# Patient Record
Sex: Male | Born: 1985
Health system: Southern US, Community
[De-identification: ages and names within clinical notes are randomized; demographics above are authoritative.]

## PROBLEM LIST (undated history)

## (undated) DIAGNOSIS — Z8619 Personal history of other infectious and parasitic diseases: Secondary | ICD-10-CM

## (undated) HISTORY — PX: WISDOM TOOTH EXTRACTION: SHX21

## (undated) HISTORY — DX: Personal history of other infectious and parasitic diseases: Z86.19

---

## 2016-02-07 DIAGNOSIS — M9905 Segmental and somatic dysfunction of pelvic region: Secondary | ICD-10-CM | POA: Diagnosis not present

## 2016-02-07 DIAGNOSIS — M9903 Segmental and somatic dysfunction of lumbar region: Secondary | ICD-10-CM | POA: Diagnosis not present

## 2016-02-07 DIAGNOSIS — M62830 Muscle spasm of back: Secondary | ICD-10-CM | POA: Diagnosis not present

## 2016-02-07 DIAGNOSIS — M9904 Segmental and somatic dysfunction of sacral region: Secondary | ICD-10-CM | POA: Diagnosis not present

## 2016-02-22 DIAGNOSIS — M62830 Muscle spasm of back: Secondary | ICD-10-CM | POA: Diagnosis not present

## 2016-02-22 DIAGNOSIS — M9905 Segmental and somatic dysfunction of pelvic region: Secondary | ICD-10-CM | POA: Diagnosis not present

## 2016-02-22 DIAGNOSIS — M9904 Segmental and somatic dysfunction of sacral region: Secondary | ICD-10-CM | POA: Diagnosis not present

## 2016-02-22 DIAGNOSIS — M9903 Segmental and somatic dysfunction of lumbar region: Secondary | ICD-10-CM | POA: Diagnosis not present

## 2017-04-10 DIAGNOSIS — J019 Acute sinusitis, unspecified: Secondary | ICD-10-CM | POA: Diagnosis not present

## 2017-04-24 ENCOUNTER — Encounter: Payer: Self-pay | Admitting: Family Medicine

## 2017-04-24 ENCOUNTER — Ambulatory Visit (INDEPENDENT_AMBULATORY_CARE_PROVIDER_SITE_OTHER): Payer: BLUE CROSS/BLUE SHIELD | Admitting: Family Medicine

## 2017-04-24 VITALS — BP 106/58 | HR 69 | Temp 98.1°F | Ht 74.0 in | Wt 171.4 lb

## 2017-04-24 DIAGNOSIS — J3489 Other specified disorders of nose and nasal sinuses: Secondary | ICD-10-CM

## 2017-04-24 MED ORDER — FLUTICASONE PROPIONATE 50 MCG/ACT NA SUSP: 2.0000 | Freq: Every day | NASAL | 6 refills | Status: DC

## 2017-04-24 NOTE — Progress Notes (Signed)
Chief Complaint  Patient presents with  . Establish Care    pt want to discuss sinus pressure-pt was seen at Urgent Care       New Patient Visit SUBJECTIVE: HPI: Patrick Dyer is an 31 y.o.male who is being seen for establishing care. Here with wife and young daughter.  For past 6 months, patient has been having issues with his left maxillary sinus. He had a painful sinus infection in January that resolved with antibiotics. He had dental pain preceding this and went to the dentist thinking he had a problem. Recently, his wife and daughter got sick and passed onto him. Shortly after he had it, he started having the severe pain in his teeth and left maxillary sinus. He recently received a course of augmentin from urgent care and was told to follow-up with a primary care physician. He is having some mild pain today. That is why he presents here today. He denies any pus or foul odor from his nose or mouth. He has never had any fevers. Denies history of recurrent infections. He has associated ear pain/popping and some congestion. He does not use a nasal spray.     Allergies  Allergen Reactions  . Sulfa Antibiotics Swelling    Redness of the skin    Past Medical History:  Diagnosis Date  . History of chicken pox    Past Surgical History:  Procedure Laterality Date  . WISDOM TOOTH EXTRACTION     Social History   Social History  . Marital status: Married   Social History Main Topics  . Smoking status: Never Smoker  . Smokeless tobacco: Never Used  . Alcohol use No  . Drug use: No   Family History  Problem Relation Age of Onset  . Cancer Neg Hx      Current Outpatient Prescriptions:  .  fluticasone (FLONASE) 50 MCG/ACT nasal spray, Place 2 sprays into both nostrils daily., Disp: 16 g, Rfl: 6  ROS Const: Denies fevers  HEENT: As noted in HPI   OBJECTIVE: BP (!) 106/58 (BP Location: Left Arm, Patient Position: Sitting, Cuff Size: Normal)   Pulse 69   Temp 98.1 F (36.7 C)  (Oral)   Ht 6\' 2"  (1.88 m)   Wt 171 lb 6.4 oz (77.7 kg)   SpO2 98%   BMI 22.01 kg/m   Constitutional: -  VS reviewed -  Well developed, well nourished, appears stated age -  No apparent distress  Psychiatric: -  Oriented to person, place, and time -  Memory intact -  Affect and mood normal -  Fluent conversation, good eye contact -  Judgment and insight age appropriate  Eye: -  Conjunctivae clear, no discharge -  Pupils symmetric, round, reactive to light  ENMT: -  Ears are patent b/l without erythema or discharge. TM's are shiny and clear b/l without evidence of effusion or infection. -  Nares patent, no D/C, mild TTP over L max sinus -  Oral mucosa without lesions, tongue and uvula midline    Tonsils not enlarged, no erythema, no exudate, trachea midline    Pharynx moist, no lesions, no erythema  Neck: -  No gross swelling, no palpable masses -  Thyroid midline, not enlarged, mobile, no palpable masses  Cardiovascular: -  RRR, no murmurs -  No LE edema  Respiratory: -  Normal respiratory effort, no accessory muscle use, no retraction -  Breath sounds equal, no wheezes, no ronchi, no crackles  Skin: -  No significant  lesion on inspection -  Warm and dry to palpation   ASSESSMENT/PLAN: Sinus pain - Plan: fluticasone (FLONASE) 50 MCG/ACT nasal spray, CT Maxillofacial WO CM  Start nasal spray daily, counseled on appropriate use. Discussed OTC antihistamines to trial. CT sinus to r/o anatomic issues. Will likely refer to ENT following results.  Patient should return prn. The patient voiced understanding and agreement to the plan.   Jilda Rocheicholas Paul [place], DO 04/24/17  4:59 PM

## 2017-04-24 NOTE — Patient Instructions (Signed)
If you do not hear anything about your CT scan in the next 1-2 weeks, call our office and ask for an update.  Claritin (loratadine), Allegra (fexofenadine), Zyrtec (cetirizine); these are listed in order from weakest to strongest. Generic, and therefore cheaper, options are in the parentheses.   Flonase (fluticasone); nasal spray that is over the counter. 2 sprays each nostril, once daily. Aim towards the same side eye when you spray.  There are available OTC, and the generic versions, which may be cheaper, are in parentheses. Show this to a pharmacist if you have trouble finding any of these items.

## 2017-04-25 ENCOUNTER — Other Ambulatory Visit: Payer: Self-pay | Admitting: Family Medicine

## 2017-04-25 ENCOUNTER — Ambulatory Visit (HOSPITAL_BASED_OUTPATIENT_CLINIC_OR_DEPARTMENT_OTHER)
Admission: RE | Admit: 2017-04-25 | Discharge: 2017-04-25 | Disposition: A | Discharge status: Home / Self Care | Payer: BLUE CROSS/BLUE SHIELD | Source: Ambulatory Visit | Attending: Family Medicine | Admitting: Family Medicine

## 2017-04-25 DIAGNOSIS — J3489 Other specified disorders of nose and nasal sinuses: Secondary | ICD-10-CM | POA: Diagnosis not present

## 2017-04-25 DIAGNOSIS — J320 Chronic maxillary sinusitis: Secondary | ICD-10-CM | POA: Diagnosis not present

## 2017-04-25 DIAGNOSIS — J342 Deviated nasal septum: Secondary | ICD-10-CM | POA: Insufficient documentation

## 2017-04-25 DIAGNOSIS — J341 Cyst and mucocele of nose and nasal sinus: Secondary | ICD-10-CM

## 2017-04-25 NOTE — Progress Notes (Signed)
ENT order placed. TY.

## 2017-05-20 DIAGNOSIS — J341 Cyst and mucocele of nose and nasal sinus: Secondary | ICD-10-CM | POA: Diagnosis not present

## 2021-03-14 ENCOUNTER — Encounter (HOSPITAL_BASED_OUTPATIENT_CLINIC_OR_DEPARTMENT_OTHER): Payer: Self-pay | Admitting: Emergency Medicine

## 2021-03-14 ENCOUNTER — Emergency Department (HOSPITAL_BASED_OUTPATIENT_CLINIC_OR_DEPARTMENT_OTHER): Payer: BC Managed Care – PPO

## 2021-03-14 ENCOUNTER — Other Ambulatory Visit: Payer: Self-pay

## 2021-03-14 ENCOUNTER — Emergency Department (HOSPITAL_BASED_OUTPATIENT_CLINIC_OR_DEPARTMENT_OTHER)
Admission: EM | Admit: 2021-03-14 | Discharge: 2021-03-14 | Disposition: A | Discharge status: Home / Self Care | Payer: BC Managed Care – PPO | Attending: Emergency Medicine | Admitting: Emergency Medicine

## 2021-03-14 DIAGNOSIS — R109 Unspecified abdominal pain: Secondary | ICD-10-CM | POA: Diagnosis not present

## 2021-03-14 DIAGNOSIS — N200 Calculus of kidney: Secondary | ICD-10-CM

## 2021-03-14 DIAGNOSIS — N201 Calculus of ureter: Secondary | ICD-10-CM

## 2021-03-14 DIAGNOSIS — N132 Hydronephrosis with renal and ureteral calculous obstruction: Secondary | ICD-10-CM | POA: Insufficient documentation

## 2021-03-14 DIAGNOSIS — N202 Calculus of kidney with calculus of ureter: Secondary | ICD-10-CM | POA: Diagnosis not present

## 2021-03-14 LAB — COMPREHENSIVE METABOLIC PANEL
ALT: 18 U/L (ref 0–44)
AST: 16 U/L (ref 15–41)
Albumin: 4.2 g/dL (ref 3.5–5.0)
Alkaline Phosphatase: 51 U/L (ref 38–126)
Anion gap: 8 (ref 5–15)
BUN: 14 mg/dL (ref 6–20)
CO2: 27 mmol/L (ref 22–32)
Calcium: 9.1 mg/dL (ref 8.9–10.3)
Chloride: 104 mmol/L (ref 98–111)
Creatinine, Ser: 1.15 mg/dL (ref 0.61–1.24)
GFR, Estimated: >60 mL/min (ref >60)
Glucose, Bld: 119 mg/dL — ABNORMAL HIGH (ref 70–99)
Potassium: 4 mmol/L (ref 3.5–5.1)
Sodium: 139 mmol/L (ref 135–145)
Total Bilirubin: 0.7 mg/dL (ref 0.3–1.2)
Total Protein: 7.5 g/dL (ref 6.5–8.1)

## 2021-03-14 LAB — URINALYSIS, ROUTINE W REFLEX MICROSCOPIC
Bilirubin Urine: NEGATIVE
Glucose, UA: NEGATIVE mg/dL
Ketones, ur: NEGATIVE mg/dL
Leukocytes,Ua: NEGATIVE
Nitrite: NEGATIVE
Protein, ur: NEGATIVE mg/dL
Specific Gravity, Urine: 1.02 (ref 1.005–1.030)
pH: 6.5 (ref 5.0–8.0)

## 2021-03-14 LAB — CBC WITH DIFFERENTIAL/PLATELET
Abs Immature Granulocytes: 0.02 10*3/uL (ref 0.00–0.07)
Basophils Absolute: 0 10*3/uL (ref 0.0–0.1)
Basophils Relative: 0 %
Eosinophils Absolute: 0.1 10*3/uL (ref 0.0–0.5)
Eosinophils Relative: 1 %
HCT: 41.3 % (ref 39.0–52.0)
Hemoglobin: 14.6 g/dL (ref 13.0–17.0)
Immature Granulocytes: 0 %
Lymphocytes Relative: 8 %
Lymphs Abs: 0.8 10*3/uL (ref 0.7–4.0)
MCH: 30 pg (ref 26.0–34.0)
MCHC: 35.4 g/dL (ref 30.0–36.0)
MCV: 84.8 fL (ref 80.0–100.0)
Monocytes Absolute: 0.6 10*3/uL (ref 0.1–1.0)
Monocytes Relative: 6 %
Neutro Abs: 8.4 10*3/uL — ABNORMAL HIGH (ref 1.7–7.7)
Neutrophils Relative %: 85 %
Platelets: 200 10*3/uL (ref 150–400)
RBC: 4.87 MIL/uL (ref 4.22–5.81)
RDW: 11.8 % (ref 11.5–15.5)
WBC: 9.9 10*3/uL (ref 4.0–10.5)
nRBC: 0 % (ref 0.0–0.2)

## 2021-03-14 LAB — LIPASE, BLOOD: Lipase: 22 U/L (ref 11–51)

## 2021-03-14 LAB — URINALYSIS, MICROSCOPIC (REFLEX)

## 2021-03-14 MED ORDER — KETOROLAC TROMETHAMINE 15 MG/ML IJ SOLN
15.0000 mg | Freq: Once | INTRAMUSCULAR | Status: AC
  Administered 2021-03-14: 15 mg via INTRAVENOUS
  Filled 2021-03-14: qty 1

## 2021-03-14 MED ORDER — LACTATED RINGERS IV BOLUS
1000.0000 mL | Freq: Once | INTRAVENOUS | Status: AC
  Administered 2021-03-14: 1000 mL via INTRAVENOUS

## 2021-03-14 MED ORDER — OXYCODONE HCL 5 MG PO TABS: 5.0000 mg | ORAL_TABLET | Freq: Four times a day (QID) | ORAL | 0 refills | Status: DC | PRN

## 2021-03-14 MED ORDER — ONDANSETRON 4 MG PO TBDP: 4.0000 mg | ORAL_TABLET | Freq: Three times a day (TID) | ORAL | 0 refills | Status: DC | PRN

## 2021-03-14 NOTE — ED Triage Notes (Signed)
Pt states he woke up around 3 am with pain in his right flank area  Pt states the pain was sharp in nature and radiated around to the front and to the groin area  Pt states he took some tylenol and that has helped some   No hx of kidney stones  Pt denies nausea or vomiting at this time

## 2021-03-14 NOTE — ED Provider Notes (Signed)
[facility] EMERGENCY DEPARTMENT Provider Note   CSN: [id number] Arrival date & time: 03/14/21  2353     History Chief Complaint  Patient presents with  . Flank Pain    Patrick Dyer is a 35 y.o. male.  The history is provided by the patient.  Flank Pain This is a new problem. The current episode started 3 to 5 hours ago. The problem occurs constantly. The problem has not changed since onset.Associated symptoms include abdominal pain. Pertinent negatives include no chest pain, no headaches and no shortness of breath. Nothing aggravates the symptoms. The symptoms are relieved by acetaminophen. He has tried acetaminophen for the symptoms. The treatment provided moderate relief.       Past Medical History:  Diagnosis Date  . History of chicken pox     There are no problems to display for this patient.   Past Surgical History:  Procedure Laterality Date  . WISDOM TOOTH EXTRACTION         Family History  Problem Relation Age of Onset  . Cancer Neg Hx     Social History   Tobacco Use  . Smoking status: Never Smoker  . Smokeless tobacco: Never Used  Vaping Use  . Vaping Use: Never used  Substance Use Topics  . Alcohol use: No  . Drug use: No    Home Medications Prior to Admission medications   Medication Sig Start Date End Date Taking? Authorizing Provider  ondansetron (ZOFRAN ODT) 4 MG disintegrating tablet Take 1 tablet (4 mg total) by mouth every 8 (eight) hours as needed for up to 10 doses for nausea or vomiting. 03/14/21  Yes Sabino Donovan, MD  oxyCODONE (ROXICODONE) 5 MG immediate release tablet Take 1 tablet (5 mg total) by mouth every 6 (six) hours as needed for up to 16 doses for severe pain. 03/14/21  Yes Sabino Donovan, MD  fluticasone [facility]) 50 MCG/ACT nasal spray Place 2 sprays into both nostrils daily. 04/24/17   Sharlene Dory, DO    Allergies    Sulfa antibiotics  Review of Systems   Review of Systems  Constitutional: Negative  for chills and fever.  HENT: Negative for congestion and rhinorrhea.   Respiratory: Negative for cough and shortness of breath.   Cardiovascular: Negative for chest pain and palpitations.  Gastrointestinal: Positive for abdominal pain and nausea. Negative for diarrhea and vomiting.  Genitourinary: Positive for flank pain. Negative for difficulty urinating, dysuria and hematuria.  Musculoskeletal: Negative for arthralgias and back pain.  Skin: Negative for color change and rash.  Neurological: Negative for light-headedness and headaches.    Physical Exam Updated Vital Signs BP 120/83 (BP Location: Right Arm)   Pulse 80   Temp 98.1 F (36.7 C) (Oral)   Resp 16   Ht 6\' 2"  (1.88 m)   Wt 85.3 kg   SpO2 100%   BMI 24.14 kg/m   Physical Exam Vitals and nursing note reviewed.  Constitutional:      General: He is not in acute distress.    Appearance: Normal appearance.  HENT:     Head: Normocephalic and atraumatic.     Nose: No rhinorrhea.  Eyes:     General:        Right eye: No discharge.        Left eye: No discharge.     Conjunctiva/sclera: Conjunctivae normal.  Cardiovascular:     Rate and Rhythm: Normal rate and regular rhythm.  Pulmonary:     Effort:  Pulmonary effort is normal.     Breath sounds: No stridor.  Abdominal:     General: Abdomen is flat. There is no distension.     Palpations: Abdomen is soft. There is no mass.     Tenderness: There is no abdominal tenderness. There is no right CVA tenderness, left CVA tenderness, guarding or rebound.     Hernia: No hernia is present.  Musculoskeletal:        General: No deformity or signs of injury.  Skin:    General: Skin is warm and dry.  Neurological:     General: No focal deficit present.     Mental Status: He is alert. Mental status is at baseline.     Motor: No weakness.  Psychiatric:        Mood and Affect: Mood normal.        Behavior: Behavior normal.        Thought Content: Thought content normal.      ED Results / Procedures / Treatments   Labs (all labs ordered are listed, but only abnormal results are displayed) Labs Reviewed  URINALYSIS, ROUTINE W REFLEX MICROSCOPIC - Abnormal; Notable for the following components:      Result Value   Hgb urine dipstick MODERATE (*)    All other components within normal limits  URINALYSIS, MICROSCOPIC (REFLEX) - Abnormal; Notable for the following components:   Bacteria, UA RARE (*)    All other components within normal limits  CBC WITH DIFFERENTIAL/PLATELET - Abnormal; Notable for the following components:   Neutro Abs 8.4 (*)    All other components within normal limits  COMPREHENSIVE METABOLIC PANEL - Abnormal; Notable for the following components:   Glucose, Bld 119 (*)    All other components within normal limits  LIPASE, BLOOD    EKG None  Radiology CT Renal Stone Study  Result Date: 03/14/2021 CLINICAL DATA:  Right flank pain with kidney stone suspected EXAM: CT ABDOMEN AND PELVIS WITHOUT CONTRAST TECHNIQUE: Multidetector CT imaging of the abdomen and pelvis was performed following the standard protocol without IV contrast. COMPARISON:  None. FINDINGS: Lower chest:  No contributory findings. Hepatobiliary: No focal liver abnormality.No evidence of biliary obstruction or stone. Pancreas: Unremarkable. Spleen: Unremarkable. Adrenals/Urinary Tract: Negative adrenals. Mild hydronephrosis on the right due to a 4 mm stone just below the UPJ. Cluster of multiple calculi at the bilateral lower poles, measuring up to 4 mm on the right. Unremarkable bladder. Stomach/Bowel:  No obstruction. No appendicitis. Vascular/Lymphatic: No acute vascular abnormality. No mass or adenopathy. Reproductive:No pathologic findings. Other: No ascites or pneumoperitoneum. Musculoskeletal: No acute abnormalities. Developmental cleft in the S1 body. IMPRESSION: 1. Mild right hydronephrosis from a 4 mm stone just below the UPJ. 2. Bilateral renal calculi clustered at  the lower poles. Electronically Signed   By: Marnee Spring M.D.   On: 03/14/2021 07:57    Procedures Procedures   Medications Ordered in ED Medications  ketorolac (TORADOL) 15 MG/ML injection 15 mg (15 mg Intravenous Given 03/14/21 0731)  lactated ringers bolus 1,000 mL (1,000 mLs Intravenous New Bag/Given 03/14/21 0734)    ED Course  I have reviewed the triage vital signs and the nursing notes.  Pertinent labs & imaging results that were available during my care of the patient were reviewed by me and considered in my medical decision making (see chart for details).    MDM Rules/Calculators/A&P  Sudden onset right flank pain that radiates to the groin.  No fevers chills.  No dysuria no hematuria.  Likely renal ureteral or bladder stone.  Urinalysis does show blood.  Rare bacteria likely contaminant.  No systemic signs of illness will check blood work we will scan will give fluids and pain control.  Nausea control offered and declined by patient.  CT imaging reviewed by radiology myself consistent with bilateral renal stones with a right UPJ stone 4 mm mild hydronephrosis renal function stable.  No signs of infection rare bacteria in the urine, likely related to mucus present.  No other inflammatory markers.  Pain is well controlled patient is tolerating p.o.  Pain medication sent.  Avoiding Flomax due to allergy.  Return precautions discussed outpatient follow-up.  Final Clinical Impression(s) / ED Diagnoses Final diagnoses:  Right ureteral stone  Right kidney stone    Rx / DC Orders ED Discharge Orders         Ordered    ondansetron (ZOFRAN ODT) 4 MG disintegrating tablet  Every 8 hours PRN        03/14/21 0807    oxyCODONE (ROXICODONE) 5 MG immediate release tablet  Every 6 hours PRN        03/14/21 0807           Sabino Donovan, MD 03/14/21 [id number]

## 2021-03-14 NOTE — Discharge Instructions (Signed)
You can take 600 mg of ibuprofen every 6 hours, you can take 1000 mg of Tylenol every 6 hours, you can alternate these every 3 or you can take them together.  If these pain medicines are not enough you have been prescribed a stronger narcotic pain medication for breakthrough pain.  Nausea tablets dissolved in the mouth.  Try and strain your urine to catch the stone.  If you do catch this do not use the specimen cup provided and you can take this to your doctor for analysis.  Determining what type of stone it is can help [place] make dietary changes and lifestyle changes that may help prevent stones in the future.

## 2021-03-14 NOTE — ED Notes (Signed)
Report from Lisa, RN.

## 2021-03-20 ENCOUNTER — Encounter: Payer: Self-pay | Admitting: Family Medicine

## 2021-03-20 ENCOUNTER — Other Ambulatory Visit: Payer: Self-pay

## 2021-03-20 ENCOUNTER — Ambulatory Visit (INDEPENDENT_AMBULATORY_CARE_PROVIDER_SITE_OTHER): Payer: BC Managed Care – PPO | Admitting: Family Medicine

## 2021-03-20 VITALS — BP 118/84 | HR 68 | Temp 97.8°F | Resp 16 | Ht 74.0 in | Wt 185.0 lb

## 2021-03-20 DIAGNOSIS — Z0000 Encounter for general adult medical examination without abnormal findings: Secondary | ICD-10-CM

## 2021-03-20 DIAGNOSIS — Z114 Encounter for screening for human immunodeficiency virus [HIV]: Secondary | ICD-10-CM | POA: Diagnosis not present

## 2021-03-20 DIAGNOSIS — Z1159 Encounter for screening for other viral diseases: Secondary | ICD-10-CM | POA: Diagnosis not present

## 2021-03-20 NOTE — Progress Notes (Signed)
Chief Complaint  Patient presents with  . Follow-up    ED 5/11 R kidney stone    Well Male Patrick Dyer is here for a complete physical.   His last physical was >1 year ago.  Current diet: in general, a "healthy" diet.   Current exercise: cycling, biking, swimming Weight trend: stable Fatigue out of ordinary? No. Seat belt? Yes.    Health maintenance Tetanus- No HIV- No Hep C- No  Past Medical History:  Diagnosis Date  . History of chicken pox      Past Surgical History:  Procedure Laterality Date  . WISDOM TOOTH EXTRACTION      Medications  Takes no meds routinely.   Allergies Allergies  Allergen Reactions  . Sulfa Antibiotics Swelling    Redness of the skin   Family History Family History  Problem Relation Age of Onset  . Cancer Neg Hx    Review of Systems: Constitutional: no fevers or chills Eye:  no recent significant change in vision Ear/Nose/Mouth/Throat:  Ears:  no hearing loss Nose/Mouth/Throat:  no complaints of nasal congestion, no sore throat Cardiovascular:  no chest pain Respiratory:  no shortness of breath Gastrointestinal:  no abdominal pain, no change in bowel habits GU:  Male: negative for dysuria Musculoskeletal/Extremities:  no pain of the joints Integumentary (Skin/Breast):  no abnormal skin lesions reported Neurologic:  no headaches Endocrine: No unexpected weight changes Hematologic/Lymphatic:  no night sweats  Exam BP 118/84 (BP Location: Left Arm, Patient Position: Sitting, Cuff Size: Small)   Pulse 68   Temp 97.8 F (36.6 C) (Oral)   Resp 16   Ht 6\' 2"  (1.88 m)   Wt 185 lb (83.9 kg)   SpO2 98%   BMI 23.75 kg/m  General:  well developed, well nourished, in no apparent distress Skin:  no significant moles, warts, or growths Head:  no masses, lesions, or tenderness Eyes:  pupils equal and round, sclera anicteric without injection Ears:  canals without lesions, TMs shiny without retraction, no obvious effusion, no  erythema Nose:  nares patent, septum midline, mucosa normal Throat/Pharynx:  lips and gingiva without lesion; tongue and uvula midline; non-inflamed pharynx; no exudates or postnasal drainage Neck: neck supple without adenopathy, thyromegaly, or masses Lungs:  clear to auscultation, breath sounds equal bilaterally, no respiratory distress Cardio:  regular rate and rhythm, no bruits, no LE edema Abdomen:  abdomen soft, nontender; bowel sounds normal; no masses or organomegaly Genital (male): Deferred Rectal: Deferred Musculoskeletal:  symmetrical muscle groups noted without atrophy or deformity Extremities:  no clubbing, cyanosis, or edema, no deformities, no skin discoloration Neuro:  gait normal; deep tendon reflexes normal and symmetric Psych: well oriented with normal range of affect and appropriate judgment/insight  Assessment and Plan  Well adult exam - Plan: CBC, Comprehensive metabolic panel  Encounter for hepatitis C screening test for low risk patient - Plan: Hepatitis C antibody  Screening for HIV without presence of risk factors - Plan: HIV Antibody (routine testing w rflx)   Well 35 y.o. male. Counseled on diet and exercise. Self testicular exams recommended at least monthly.  Had lipids done at work.  Other orders as above. Follow up in 1 year pending the above workup. The patient voiced understanding and agreement to the plan.  32 [place], DO 03/20/21 11:41 AM

## 2021-03-20 NOTE — Patient Instructions (Addendum)
Give [place] 2-3 business days to get the results of your labs back.   Stay hydrated.   Keep the diet clean and stay active.  Do monthly self testicular checks in the shower. You are feeling for lumps/bumps that don't belong. If you feel anything like this, let me know!  Let us know if you need anything.

## 2021-10-17 ENCOUNTER — Encounter (HOSPITAL_COMMUNITY): Payer: Self-pay

## 2021-10-17 ENCOUNTER — Emergency Department (HOSPITAL_COMMUNITY): Payer: BC Managed Care – PPO

## 2021-10-17 ENCOUNTER — Other Ambulatory Visit: Payer: Self-pay

## 2021-10-17 ENCOUNTER — Emergency Department (HOSPITAL_COMMUNITY)
Admission: EM | Admit: 2021-10-17 | Discharge: 2021-10-18 | Disposition: A | Discharge status: Home / Self Care | Payer: BC Managed Care – PPO | Attending: Emergency Medicine | Admitting: Emergency Medicine

## 2021-10-17 DIAGNOSIS — N132 Hydronephrosis with renal and ureteral calculous obstruction: Secondary | ICD-10-CM | POA: Diagnosis not present

## 2021-10-17 DIAGNOSIS — R109 Unspecified abdominal pain: Secondary | ICD-10-CM | POA: Diagnosis not present

## 2021-10-17 DIAGNOSIS — N200 Calculus of kidney: Secondary | ICD-10-CM | POA: Diagnosis not present

## 2021-10-17 LAB — BASIC METABOLIC PANEL
Anion gap: 10 (ref 5–15)
BUN: 19 mg/dL (ref 6–20)
CO2: 23 mmol/L (ref 22–32)
Calcium: 9 mg/dL (ref 8.9–10.3)
Chloride: 101 mmol/L (ref 98–111)
Creatinine, Ser: 1.52 mg/dL — ABNORMAL HIGH (ref 0.61–1.24)
GFR, Estimated: >60 mL/min (ref >60)
Glucose, Bld: 108 mg/dL — ABNORMAL HIGH (ref 70–99)
Potassium: 3.6 mmol/L (ref 3.5–5.1)
Sodium: 134 mmol/L — ABNORMAL LOW (ref 135–145)

## 2021-10-17 LAB — URINALYSIS, ROUTINE W REFLEX MICROSCOPIC
Bilirubin Urine: NEGATIVE
Glucose, UA: NEGATIVE mg/dL
Ketones, ur: >80 mg/dL — AB
Leukocytes,Ua: NEGATIVE
Nitrite: NEGATIVE
Specific Gravity, Urine: 1.02 (ref 1.005–1.030)
pH: 8 (ref 5.0–8.0)

## 2021-10-17 LAB — URINALYSIS, MICROSCOPIC (REFLEX)
Bacteria, UA: NONE SEEN
WBC, UA: NONE SEEN WBC/hpf (ref 0–5)

## 2021-10-17 LAB — CBC
HCT: 40.6 % (ref 39.0–52.0)
Hemoglobin: 14.5 g/dL (ref 13.0–17.0)
MCH: 30 pg (ref 26.0–34.0)
MCHC: 35.7 g/dL (ref 30.0–36.0)
MCV: 83.9 fL (ref 80.0–100.0)
Platelets: 229 10*3/uL (ref 150–400)
RBC: 4.84 MIL/uL (ref 4.22–5.81)
RDW: 11.4 % — ABNORMAL LOW (ref 11.5–15.5)
WBC: 12.7 10*3/uL — ABNORMAL HIGH (ref 4.0–10.5)
nRBC: 0 % (ref 0.0–0.2)

## 2021-10-17 MED ORDER — HYDROMORPHONE HCL 1 MG/ML IJ SOLN
0.5000 mg | Freq: Once | INTRAMUSCULAR | Status: AC
  Administered 2021-10-18: 0.5 mg via INTRAVENOUS
  Filled 2021-10-17: qty 1

## 2021-10-17 MED ORDER — HYDROMORPHONE HCL 1 MG/ML IJ SOLN: 1.0000 mg | Freq: Once | INTRAMUSCULAR | Status: DC

## 2021-10-17 MED ORDER — HYDROCODONE-ACETAMINOPHEN 5-325 MG PO TABS
1.0000 | ORAL_TABLET | Freq: Once | ORAL | Status: AC
  Administered 2021-10-17: 22:00:00 1 via ORAL
  Filled 2021-10-17: qty 1

## 2021-10-17 MED ORDER — ONDANSETRON 4 MG PO TBDP
4.0000 mg | ORAL_TABLET | Freq: Once | ORAL | Status: AC
  Administered 2021-10-17: 22:00:00 4 mg via ORAL
  Filled 2021-10-17: qty 1

## 2021-10-17 MED ORDER — SODIUM CHLORIDE 0.9 % IV BOLUS
1000.0000 mL | Freq: Once | INTRAVENOUS | Status: AC
  Administered 2021-10-18: 1000 mL via INTRAVENOUS

## 2021-10-17 NOTE — ED Provider Notes (Signed)
Emergency Medicine Provider Triage Evaluation Note  DEMETRIES COIA , a 35 y.o. male  was evaluated in triage.  Pt complains of right sided flank pain, intermittent and then constant. Endorses minimal dysuria, denies hematuria. Hx kidney stones x 2. Nausea, vomiting x 1 earlier today.  Review of Systems  Positive: As above Negative: As above  Physical Exam  BP 121/68 (BP Location: Left Arm)    Pulse 65    Temp 98.3 F (36.8 C) (Oral)    Resp 19    Ht 6\' 2"  (1.88 m)    Wt 81.6 kg    SpO2 100%    BMI 23.11 kg/m  Gen:   Awake, no distress   Resp:  Normal effort  MSK:   Moves extremities without difficulty  Other:  Some right flank pain, minimal suprapubic ttp  Medical Decision Making  Medically screening exam initiated at 9:54 PM.  Appropriate orders placed.  JAMARQUIS CRULL was informed that the remainder of the evaluation will be completed by another provider, this initial triage assessment does not replace that evaluation, and the importance of remaining in the ED until their evaluation is complete.  1500 mg tylenol, 1 hydrocodone PTA   Elvia Collum 10/17/21 2155    2156, MD 10/18/21 [phone]

## 2021-10-17 NOTE — ED Triage Notes (Addendum)
Patient from home with report of right sided flank pain that began at 0800. Pt reports hx of 2 kidney stones in the past year, states pain is the same as prior. Pt endorses 1 episode of vomiting at 1800. Denies hematuria or difficulty with urination.

## 2021-10-18 MED ORDER — KETOROLAC TROMETHAMINE 15 MG/ML IJ SOLN
15.0000 mg | Freq: Once | INTRAMUSCULAR | Status: AC
  Administered 2021-10-18: 15 mg via INTRAVENOUS
  Filled 2021-10-18: qty 1

## 2021-10-18 MED ORDER — HYDROMORPHONE HCL 1 MG/ML IJ SOLN
1.0000 mg | Freq: Once | INTRAMUSCULAR | Status: AC
  Administered 2021-10-18: 1 mg via INTRAVENOUS
  Filled 2021-10-18: qty 1

## 2021-10-18 MED ORDER — OXYCODONE-ACETAMINOPHEN 5-325 MG PO TABS
1.0000 | ORAL_TABLET | Freq: Once | ORAL | Status: AC
Start: 2021-10-18 — End: 2021-10-18
  Administered 2021-10-18: 1 via ORAL
  Filled 2021-10-18: qty 1

## 2021-10-18 MED ORDER — OXYCODONE-ACETAMINOPHEN 5-325 MG PO TABS: 1.0000 | ORAL_TABLET | Freq: Four times a day (QID) | ORAL | 0 refills | Status: DC | PRN

## 2021-10-18 MED ORDER — ONDANSETRON 4 MG PO TBDP: 4.0000 mg | ORAL_TABLET | Freq: Three times a day (TID) | ORAL | 0 refills | Status: DC | PRN

## 2021-10-18 NOTE — Discharge Instructions (Addendum)
You were seen in the emergency department and found to have a kidney stone that is 5 mm in size on the right side. Your creatinine (a measure of kidney) was mildly elevated likely related to the stone- please have this rechecked by primary care or urology.  We are sending you home with medications to help with your symptoms.   -Percocet-this is a narcotic/controlled substance medication that has potential addicting qualities.  We recommend that you take 1-2 tablets every 6 hours as needed for severe pain.  Do not drive or operate heavy machinery when taking this medicine as it can be sedating. Do not drink alcohol or take other sedating medications when taking this medicine for safety reasons.  Keep this out of reach of small children.  Please be aware this medicine has Tylenol in it (325 mg/tab) do not exceed the maximum dose of Tylenol in a day per over the counter recommendations should you decide to supplement with Tylenol over the counter.   -Zofran-this is an antinausea medication, you may take this every 8 hours as needed for nausea and vomiting, please allow the tablet to dissolve underneath of your tongue.   We have prescribed you new medication(s) today. Discuss the medications prescribed today with your pharmacist as they can have adverse effects and interactions with your other medicines including over the counter and prescribed medications. Seek medical evaluation if you start to experience new or abnormal symptoms after taking one of these medicines, seek care immediately if you start to experience difficulty breathing, feeling of your throat closing, facial swelling, or rash as these could be indications of a more serious allergic reaction  Please follow-up with the urology group provided in your discharge instructions within 3 to 5 days.  Return to the ER for new or worsening symptoms including but not limited to new or worsening pain not controlled by these medicines, inability to keep  fluids down, fever, or any other concerns that you may have.

## 2021-10-18 NOTE — ED Provider Notes (Signed)
[facility]-EMERGENCY DEPT Provider Note   CSN: [id number] Arrival date & time: 10/17/21  2131     History Chief Complaint  Patient presents with   Flank Pain    Patrick Dyer is a 35 y.o. male who presents to the emergency department with complaints of right flank pain that began this morning.  Pain is in the right flank, briefly radiated into the abdomen but has otherwise been consistent in the flank.  Worse in certain positions.  No significant alleviating factors.  Associated nausea with one episode of emesis.  Reports he did have an episode of dysuria, this has resolved.  Reports history of kidney stones.  He denies fever, chills, hematuria, testicular pain/swelling, or diarrhea.  HPI     Past Medical History:  Diagnosis Date   History of chicken pox     There are no problems to display for this patient.   Past Surgical History:  Procedure Laterality Date   WISDOM TOOTH EXTRACTION         Family History  Problem Relation Age of Onset   Cancer Neg Hx     Social History   Tobacco Use   Smoking status: Never   Smokeless tobacco: Never  Vaping Use   Vaping Use: Never used  Substance Use Topics   Alcohol use: No   Drug use: No    Home Medications Prior to Admission medications   Not on File    Allergies    Sulfa antibiotics  Review of Systems   Review of Systems  Constitutional:  Negative for chills and fever.  Respiratory:  Negative for cough and shortness of breath.   Cardiovascular:  Negative for chest pain.  Gastrointestinal:  Positive for nausea and vomiting. Negative for diarrhea.  Genitourinary:  Positive for dysuria (resolved) and flank pain. Negative for hematuria, scrotal swelling and testicular pain.  All other systems reviewed and are negative.  Physical Exam Updated Vital Signs BP 133/71    Pulse 93    Temp 98.3 F (36.8 C) (Oral)    Resp 18    Ht 6\' 2"  (1.88 m)    Wt 81.6 kg    SpO2 94%    BMI 23.11 kg/m    Physical Exam Vitals and nursing note reviewed.  Constitutional:      General: He is not in acute distress.    Appearance: He is well-developed. He is not toxic-appearing.  HENT:     Head: Normocephalic and atraumatic.  Eyes:     General:        Right eye: No discharge.        Left eye: No discharge.     Conjunctiva/sclera: Conjunctivae normal.  Cardiovascular:     Rate and Rhythm: Normal rate and regular rhythm.  Pulmonary:     Effort: Pulmonary effort is normal. No respiratory distress.     Breath sounds: Normal breath sounds. No wheezing, rhonchi or rales.  Abdominal:     General: There is no distension.     Palpations: Abdomen is soft.     Tenderness: There is no abdominal tenderness. There is right CVA tenderness. There is no left CVA tenderness or guarding.  Musculoskeletal:     Cervical back: Neck supple.  Skin:    General: Skin is warm and dry.     Findings: No rash.  Neurological:     Mental Status: He is alert.     Comments: Clear speech.   Psychiatric:  Behavior: Behavior normal.    ED Results / Procedures / Treatments   Labs (all labs ordered are listed, but only abnormal results are displayed) Labs Reviewed  URINALYSIS, ROUTINE W REFLEX MICROSCOPIC - Abnormal; Notable for the following components:      Result Value   Hgb urine dipstick TRACE (*)    Ketones, ur >80 (*)    Protein, ur TRACE (*)    All other components within normal limits  BASIC METABOLIC PANEL - Abnormal; Notable for the following components:   Sodium 134 (*)    Glucose, Bld 108 (*)    Creatinine, Ser 1.52 (*)    All other components within normal limits  CBC - Abnormal; Notable for the following components:   WBC 12.7 (*)    RDW 11.4 (*)    All other components within normal limits  URINALYSIS, MICROSCOPIC (REFLEX)    EKG None  Radiology CT Renal Stone Study  Result Date: 10/17/2021 CLINICAL DATA:  Right side flank pain EXAM: CT ABDOMEN AND PELVIS WITHOUT CONTRAST  TECHNIQUE: Multidetector CT imaging of the abdomen and pelvis was performed following the standard protocol without IV contrast. COMPARISON:  03/14/2021 FINDINGS: Lower chest: Lung bases are clear. No effusions. Heart is normal size. Hepatobiliary: No focal hepatic abnormality. Gallbladder unremarkable. Pancreas: No focal abnormality or ductal dilatation. Spleen: No focal abnormality.  Normal size. Adrenals/Urinary Tract: Punctate nonobstructing stones in the lower pole of the left kidney. No hydronephrosis on the left. Mild hydronephrosis on the right 2 to 5 mm distal right ureteral stone. Adrenal glands and urinary bladder unremarkable. Stomach/Bowel: Normal appendix. Stomach, large and small bowel grossly unremarkable. Vascular/Lymphatic: No evidence of aneurysm or adenopathy. Reproductive: No visible focal abnormality. Other: No free fluid or free air. Musculoskeletal: No acute bony abnormality. IMPRESSION: 5 mm right UVJ stone with mild right hydronephrosis. Left punctate nephrolithiasis. Electronically Signed   By: Rolm Baptise M.D.   On: 10/17/2021 22:14    Procedures Procedures   Medications Ordered in ED Medications  ondansetron (ZOFRAN-ODT) disintegrating tablet 4 mg (4 mg Oral Given 10/17/21 2225)  HYDROcodone-acetaminophen (NORCO/VICODIN) 5-325 MG per tablet 1 tablet (1 tablet Oral Given 10/17/21 2225)  sodium chloride 0.9 % bolus 1,000 mL (1,000 mLs Intravenous New Bag/Given 10/18/21 0008)  HYDROmorphone (DILAUDID) injection 0.5 mg (0.5 mg Intravenous Given 10/18/21 0007)    ED Course  I have reviewed the triage vital signs and the nursing notes.  Pertinent labs & imaging results that were available during my care of the patient were reviewed by me and considered in my medical decision making (see chart for details).    MDM Rules/Calculators/A&P                           Patient presents to the ED with complaints of flank pain.. Patient nontoxic appearing, vitals without  significant abnormalities. On exam patient noted to have R CVA tenderness, no peritoneal signs. DDX: nephrolithiasis, pyelonephritis/UTI, cholecystitis, bowel obstruction/perforation, appendicitis, dissection  Additional hx obtained from chart review & nursing note review.   Labs ordered in triage reviewed & interpreted by me:  UA; No obvious UTI CBC: Mild leukocytosis felt to be nonspecific.  BMP: Creatinine mildly increased.  UA: Ketonuria, hgb present, no UTI  CT renal study ordered in triage, reviewed & interpreted by me, agree with radiologist impression: 5 mm right UVJ stone with mild right hydronephrosis. Left punctate nephrolithiasis  Mild elevation in creatine. Urinalysis without appearance of superimposed  infection.   02:00: Patient with pain continued @ 7/10 following 2nd dose of dilaudid, discussed w/ attending regarding toradol with his mildly elevated creatinine- in agreement with proceeding with this.   Patient tolerating PO in the ER with pain improved, state he feels okay going home at this time through shared decision making conversation. Will discharge home with Percocet & zofran with urology follow up, flomax deferred as patient had significant swelling with sulfa. [place] Controlled Substance reporting System queried. I discussed results, treatment plan, need for urology follow-up, and strict return precautions with the patient. Provided opportunity for questions, patient confirmed understanding and is in agreement with plan.   Findings and plan of care discussed with supervising physician Dr. Stark Jock who is in agreement.    Final Clinical Impression(s) / ED Diagnoses Final diagnoses:  Kidney stone    Rx / DC Orders ED Discharge Orders          Ordered    oxyCODONE-acetaminophen (PERCOCET/ROXICET) 5-325 MG tablet  Every 6 hours PRN        10/18/21 0247    ondansetron (ZOFRAN-ODT) 4 MG disintegrating tablet  Every 8 hours PRN        10/18/21 0247              Amaryllis Dyke, PA-C 10/18/21 0426    Veryl Speak, MD 10/18/21 [id number]

## 2021-10-24 DIAGNOSIS — N201 Calculus of ureter: Secondary | ICD-10-CM | POA: Diagnosis not present

## 2021-11-14 DIAGNOSIS — N202 Calculus of kidney with calculus of ureter: Secondary | ICD-10-CM | POA: Diagnosis not present

## 2022-03-22 ENCOUNTER — Encounter: Payer: Self-pay | Admitting: Family Medicine

## 2022-03-22 ENCOUNTER — Ambulatory Visit (INDEPENDENT_AMBULATORY_CARE_PROVIDER_SITE_OTHER): Payer: BC Managed Care – PPO | Admitting: Family Medicine

## 2022-03-22 VITALS — BP 110/62 | HR 64 | Temp 98.0°F | Ht 74.0 in | Wt 170.1 lb

## 2022-03-22 DIAGNOSIS — Z114 Encounter for screening for human immunodeficiency virus [HIV]: Secondary | ICD-10-CM

## 2022-03-22 DIAGNOSIS — Z23 Encounter for immunization: Secondary | ICD-10-CM | POA: Diagnosis not present

## 2022-03-22 DIAGNOSIS — Z0000 Encounter for general adult medical examination without abnormal findings: Secondary | ICD-10-CM

## 2022-03-22 DIAGNOSIS — Z1159 Encounter for screening for other viral diseases: Secondary | ICD-10-CM

## 2022-03-22 LAB — CBC
HCT: 40.3 % (ref 39.0–52.0)
Hemoglobin: 14 g/dL (ref 13.0–17.0)
MCHC: 34.7 g/dL (ref 30.0–36.0)
MCV: 86.3 fl (ref 78.0–100.0)
Platelets: 276 10*3/uL (ref 150.0–400.0)
RBC: 4.67 Mil/uL (ref 4.22–5.81)
RDW: 12 % (ref 11.5–15.5)
WBC: 5.2 10*3/uL (ref 4.0–10.5)

## 2022-03-22 LAB — COMPREHENSIVE METABOLIC PANEL
ALT: 13 U/L (ref 0–53)
AST: 13 U/L (ref 0–37)
Albumin: 4.6 g/dL (ref 3.5–5.2)
Alkaline Phosphatase: 46 U/L (ref 39–117)
BUN: 12 mg/dL (ref 6–23)
CO2: 30 mEq/L (ref 19–32)
Calcium: 9.3 mg/dL (ref 8.4–10.5)
Chloride: 102 mEq/L (ref 96–112)
Creatinine, Ser: 1.08 mg/dL (ref 0.40–1.50)
GFR: 89.57 mL/min (ref >60.00)
Glucose, Bld: 85 mg/dL (ref 70–99)
Potassium: 4 mEq/L (ref 3.5–5.1)
Sodium: 140 mEq/L (ref 135–145)
Total Bilirubin: 0.6 mg/dL (ref 0.2–1.2)
Total Protein: 7 g/dL (ref 6.0–8.3)

## 2022-03-22 LAB — LIPID PANEL
Cholesterol: 107 mg/dL (ref 0–200)
HDL: 31.8 mg/dL — ABNORMAL LOW (ref >39.00)
LDL Cholesterol: 64 mg/dL (ref 0–99)
NonHDL: 74.75
Total CHOL/HDL Ratio: 3
Triglycerides: 56 mg/dL (ref 0.0–149.0)
VLDL: 11.2 mg/dL (ref 0.0–40.0)

## 2022-03-22 NOTE — Addendum Note (Signed)
Addended by: Scharlene Gloss B on: 03/22/2022 09:39 AM   Modules accepted: Orders

## 2022-03-22 NOTE — Patient Instructions (Signed)
Give us 2-3 business days to get the results of your labs back.   Keep the diet clean and stay active.  Please get me a copy of your advanced directive form at your convenience.   Do monthly self testicular checks in the shower. You are feeling for lumps/bumps that don't belong. If you feel anything like this, let me know!  Let us know if you need anything.  

## 2022-03-22 NOTE — Progress Notes (Signed)
Chief Complaint  Patient presents with   Annual Exam    Well Male Patrick Dyer is here for a complete physical.   His last physical was >1 year ago.  Current diet: in general, a "healthy" diet.   Current exercise: soccer, cycling Weight trend: down a few lbs, just got over stomach bug (lost 12 lbs)  Fatigue out of ordinary? No. Seat belt? Yes.   Advanced directive? No  Health maintenance Tetanus- Due HIV- Due Hep C- No  Past Medical History:  Diagnosis Date   History of chicken pox      Past Surgical History:  Procedure Laterality Date   WISDOM TOOTH EXTRACTION      Medications  Takes no meds routinely.     Allergies Allergies  Allergen Reactions   Sulfa Antibiotics Swelling    Redness of the skin    Family History Family History  Problem Relation Age of Onset   Cancer Neg Hx     Review of Systems: Constitutional: no fevers or chills Eye:  no recent significant change in vision Ear/Nose/Mouth/Throat:  Ears:  no hearing loss Nose/Mouth/Throat:  no complaints of nasal congestion, no sore throat Cardiovascular:  no chest pain Respiratory:  no shortness of breath Gastrointestinal:  no abdominal pain, no change in bowel habits GU:  Male: negative for dysuria Musculoskeletal/Extremities:  no pain of the joints Integumentary (Skin/Breast):  no abnormal skin lesions reported Neurologic:  no headaches Endocrine: No unexpected weight changes Hematologic/Lymphatic:  no night sweats  Exam BP 110/62   Pulse 64   Temp 98 F (36.7 C) (Oral)   Ht 6\' 2"  (1.88 m)   Wt 170 lb 2 oz (77.2 kg)   SpO2 97%   BMI 21.84 kg/m  General:  well developed, well nourished, in no apparent distress Skin:  no significant moles, warts, or growths Head:  no masses, lesions, or tenderness Eyes:  pupils equal and round, sclera anicteric without injection Ears:  canals without lesions, TMs shiny without retraction, no obvious effusion, no erythema Nose:  nares patent, septum  midline, mucosa normal Throat/Pharynx:  lips and gingiva without lesion; tongue and uvula midline; non-inflamed pharynx; no exudates or postnasal drainage Neck: neck supple without adenopathy, thyromegaly, or masses Lungs:  clear to auscultation, breath sounds equal bilaterally, no respiratory distress Cardio:  regular rate and rhythm, no bruits, no LE edema Abdomen:  abdomen soft, nontender; bowel sounds normal; no masses or organomegaly Genital (male): Deferred Rectal: Deferred Musculoskeletal:  symmetrical muscle groups noted without atrophy or deformity Extremities:  no clubbing, cyanosis, or edema, no deformities, no skin discoloration Neuro:  gait normal; deep tendon reflexes normal and symmetric Psych: well oriented with normal range of affect and appropriate judgment/insight  Assessment and Plan  Well adult exam - Plan: Lipid panel, CBC, Comprehensive metabolic panel  Encounter for hepatitis C screening test for low risk patient - Plan: Hepatitis C antibody  Screening for HIV without presence of risk factors - Plan: HIV Antibody (routine testing w rflx)   Well 36 y.o. male. Counseled on diet and exercise. Self testicular exams recommended at least monthly.  Tdap today.  Advanced directive form provided today.  Other orders as above. Follow up in 1 year pending the above workup. The patient voiced understanding and agreement to the plan.  20 [place], DO 03/22/22 9:27 AM

## 2022-03-25 LAB — HEPATITIS C ANTIBODY
Hepatitis C Ab: NONREACTIVE
SIGNAL TO CUT-OFF: 0.21 (ref <1.00)

## 2022-03-25 LAB — HIV ANTIBODY (ROUTINE TESTING W REFLEX): HIV 1&2 Ab, 4th Generation: NONREACTIVE

## 2023-03-26 ENCOUNTER — Encounter: Payer: Self-pay | Admitting: Family Medicine

## 2023-03-26 ENCOUNTER — Ambulatory Visit (INDEPENDENT_AMBULATORY_CARE_PROVIDER_SITE_OTHER): Payer: BC Managed Care – PPO | Admitting: Family Medicine

## 2023-03-26 VITALS — BP 112/76 | HR 71 | Temp 98.3°F | Ht 74.0 in | Wt 184.4 lb

## 2023-03-26 DIAGNOSIS — R5383 Other fatigue: Secondary | ICD-10-CM

## 2023-03-26 DIAGNOSIS — Z0000 Encounter for general adult medical examination without abnormal findings: Secondary | ICD-10-CM

## 2023-03-26 DIAGNOSIS — Z3009 Encounter for other general counseling and advice on contraception: Secondary | ICD-10-CM

## 2023-03-26 DIAGNOSIS — Z01419 Encounter for gynecological examination (general) (routine) without abnormal findings: Secondary | ICD-10-CM

## 2023-03-26 LAB — COMPREHENSIVE METABOLIC PANEL
ALT: 10 U/L (ref 0–53)
AST: 14 U/L (ref 0–37)
Albumin: 4.5 g/dL (ref 3.5–5.2)
Alkaline Phosphatase: 45 U/L (ref 39–117)
BUN: 15 mg/dL (ref 6–23)
CO2: 26 mEq/L (ref 19–32)
Calcium: 9.3 mg/dL (ref 8.4–10.5)
Chloride: 103 mEq/L (ref 96–112)
Creatinine, Ser: 1.05 mg/dL (ref 0.40–1.50)
GFR: 92 mL/min (ref >60.00)
Glucose, Bld: 88 mg/dL (ref 70–99)
Potassium: 3.8 mEq/L (ref 3.5–5.1)
Sodium: 138 mEq/L (ref 135–145)
Total Bilirubin: 1.5 mg/dL — ABNORMAL HIGH (ref 0.2–1.2)
Total Protein: 6.9 g/dL (ref 6.0–8.3)

## 2023-03-26 LAB — CBC
HCT: 41.3 % (ref 39.0–52.0)
Hemoglobin: 14.4 g/dL (ref 13.0–17.0)
MCHC: 34.8 g/dL (ref 30.0–36.0)
MCV: 85.1 fl (ref 78.0–100.0)
Platelets: 254 10*3/uL (ref 150.0–400.0)
RBC: 4.85 Mil/uL (ref 4.22–5.81)
RDW: 12.1 % (ref 11.5–15.5)
WBC: 4.9 10*3/uL (ref 4.0–10.5)

## 2023-03-26 LAB — TESTOSTERONE: Testosterone: 312.82 ng/dL (ref 300.00–890.00)

## 2023-03-26 LAB — LIPID PANEL
Cholesterol: 159 mg/dL (ref 0–200)
HDL: 41.4 mg/dL (ref >39.00)
LDL Cholesterol: 97 mg/dL (ref 0–99)
NonHDL: 117.61
Total CHOL/HDL Ratio: 4
Triglycerides: 103 mg/dL (ref 0.0–149.0)
VLDL: 20.6 mg/dL (ref 0.0–40.0)

## 2023-03-26 NOTE — Progress Notes (Signed)
Chief Complaint  Patient presents with   Annual Exam    Well Male Patrick Dyer is here for a complete physical.   His last physical was >1 year ago.  Current diet: in general, diet OK.   Current exercise: soccer Weight trend: up a few lbs Fatigue out of ordinary? Some fatigue Seat belt? Yes.   Advanced directive? No  Health maintenance Tetanus- Yes HIV- Yes Hep C- Yes  Past Medical History:  Diagnosis Date   History of chicken pox     Past Surgical History:  Procedure Laterality Date   WISDOM TOOTH EXTRACTION     Medications  Takes no meds routinely.   Allergies Allergies  Allergen Reactions   Sulfa Antibiotics Swelling    Redness of the skin   Family History Family History  Problem Relation Age of Onset   Cancer Neg Hx     Review of Systems: Constitutional: no fevers or chills Eye:  no recent significant change in vision Ear/Nose/Mouth/Throat:  Ears:  no hearing loss Nose/Mouth/Throat:  no complaints of nasal congestion, no sore throat Cardiovascular:  no chest pain Respiratory:  no shortness of breath Gastrointestinal:  no abdominal pain, no change in bowel habits GU:  Male: negative for dysuria Musculoskeletal/Extremities:  no pain of the joints Integumentary (Skin/Breast):  no abnormal skin lesions reported Neurologic:  no headaches Endocrine: No unexpected weight changes Hematologic/Lymphatic:  no night sweats  Exam BP 112/76 (BP Location: Right Arm, Patient Position: Sitting, Cuff Size: Normal)   Pulse 71   Temp 98.3 F (36.8 C) (Oral)   Ht 6\' 2"  (1.88 m)   Wt 184 lb 6 oz (83.6 kg)   SpO2 99%   BMI 23.67 kg/m  General:  well developed, well nourished, in no apparent distress Skin:  no significant moles, warts, or growths Head:  no masses, lesions, or tenderness Eyes:  pupils equal and round, sclera anicteric without injection Ears:  canals without lesions, TMs shiny without retraction, no obvious effusion, no erythema Nose:  nares  patent, mucosa normal Throat/Pharynx:  lips and gingiva without lesion; tongue and uvula midline; non-inflamed pharynx; no exudates or postnasal drainage Neck: neck supple without adenopathy, thyromegaly, or masses Lungs:  clear to auscultation, breath sounds equal bilaterally, no respiratory distress Cardio:  regular rate and rhythm, no bruits, no LE edema Abdomen:  abdomen soft, nontender; bowel sounds normal; no masses or organomegaly Genital (male): Deferred Rectal: Deferred Musculoskeletal:  symmetrical muscle groups noted without atrophy or deformity Extremities:  no clubbing, cyanosis, or edema, no deformities, no skin discoloration Neuro:  gait normal; deep tendon reflexes normal and symmetric Psych: well oriented with normal range of affect and appropriate judgment/insight  Assessment and Plan  Well adult exam - Plan: CBC, Comprehensive metabolic panel, Lipid panel  Fatigue, unspecified type - Plan: Testosterone  Vasectomy evaluation - Plan: Ambulatory referral to Urology   Well 37 y.o. male. Counseled on diet and exercise. Self testicular exams recommended at least monthly.  Advanced directive form requested today.  Other orders as above. Follow up in 1 year pending the above workup. The patient voiced understanding and agreement to the plan.  Jilda Roche [place], DO 03/26/23 9:38 AM

## 2023-03-26 NOTE — Patient Instructions (Addendum)
Give us 2-3 business days to get the results of your labs back.   Keep the diet clean and stay active.  Please get me a copy of your advanced directive form at your convenience.   Do monthly self testicular checks in the shower. You are feeling for lumps/bumps that don't belong. If you feel anything like this, let me know!  If you do not hear anything about your referral in the next 1-2 weeks, call our office and ask for an update.  Let us know if you need anything.  

## 2023-04-10 ENCOUNTER — Encounter: Payer: BC Managed Care – PPO | Admitting: Urology

## 2023-04-10 ENCOUNTER — Encounter: Payer: Self-pay | Admitting: Urology

## 2023-04-10 ENCOUNTER — Ambulatory Visit (INDEPENDENT_AMBULATORY_CARE_PROVIDER_SITE_OTHER): Payer: BC Managed Care – PPO | Admitting: Urology

## 2023-04-10 VITALS — BP 129/80 | HR 72 | Ht 74.0 in | Wt 184.0 lb

## 2023-04-10 DIAGNOSIS — Z3009 Encounter for other general counseling and advice on contraception: Secondary | ICD-10-CM

## 2023-04-10 NOTE — Progress Notes (Signed)
   Assessment: 1. Sterilization consult      Plan: As to the procedure, no scalpel technique vasectomy is explained and reviewed in detail.  Generalized risks including but not limited to bleeding, infection, orchalgia, testicular atrophy, epididymitis, scrotal hematoma, and chronic pain are discussed.   Additionally, he understands that the possibility of vas recanalization following vasectomy is possible although rare.  Most importantly, the patient understands that he is not sterile initially and will need a semen analysis check to confirm sterility such that no sperm are seen.  He is advised to avoid ejaculation for 10 days following the procedure.  The initial semen analysis will be checked in approximately 12 weeks and in some patients, several months may be required for clearance of all sperm.  He reports a clear understanding of the need for continued birth control until sterility is confirmed.  Otherwise, general issues regarding local anesthesia, prep, alprazolam are discussed and he reports a clear understanding.   Chief Complaint: Patient desires vasectomy  History of Present Illness:  Patrick Dyer is a 37 y.o. male who is seen in consultation from [place], Jilda Roche, DO for vasectomy consult.  The patient and his wife have 2 children and have carefully considered permanent sterilization and are comfortable with their decision.   Past Medical History:  Past Medical History:  Diagnosis Date   History of chicken pox     Past Surgical History:  Past Surgical History:  Procedure Laterality Date   WISDOM TOOTH EXTRACTION      Allergies:  Allergies  Allergen Reactions   Sulfa Antibiotics Swelling    Redness of the skin    Family History:  Family History  Problem Relation Age of Onset   Cancer Neg Hx     Social History:  Social History   Tobacco Use   Smoking status: Never   Smokeless tobacco: Never  Vaping Use   Vaping Use: Never used  Substance Use Topics    Alcohol use: No   Drug use: No    Review of symptoms:  Constitutional:  Negative for unexplained weight loss, night sweats, fever, chills ENT:  Negative for nose bleeds, sinus pain, painful swallowing CV:  Negative for chest pain, shortness of breath, exercise intolerance, palpitations, loss of consciousness Resp:  Negative for cough, wheezing, shortness of breath GI:  Negative for nausea, vomiting, diarrhea, bloody stools GU:  Positives noted in HPI; otherwise negative for gross hematuria, dysuria, urinary incontinence Neuro:  Negative for seizures, poor balance, limb weakness, slurred speech Psych:  Negative for lack of energy, depression, anxiety Endocrine:  Negative for polydipsia, polyuria, symptoms of hypoglycemia (dizziness, hunger, sweating) Hematologic:  Negative for anemia, purpura, petechia, prolonged or excessive bleeding, use of anticoagulants  Allergic:  Negative for difficulty breathing or choking as a result of exposure to anything; no shellfish allergy; no allergic response (rash/itch) to materials, foods  Physical exam: BP 129/80   Pulse 72   Ht 6\' 2"  (1.88 m)   Wt 184 lb (83.5 kg)   BMI 23.62 kg/m  GENERAL APPEARANCE:  Well appearing, well developed, well nourished, NAD  GU: Normal circumcised phallus.  Normal left testis and spermatic cord.  Vas deferens easily palpable.  Normal right testis and cord.  Vas deferens on the right also easily palpable

## 2023-05-01 ENCOUNTER — Ambulatory Visit (INDEPENDENT_AMBULATORY_CARE_PROVIDER_SITE_OTHER): Payer: BC Managed Care – PPO | Admitting: Urology

## 2023-05-01 DIAGNOSIS — Z302 Encounter for sterilization: Secondary | ICD-10-CM

## 2023-05-01 NOTE — Progress Notes (Signed)
   Assessment: 1. Encounter for sterilization     Plan: Per vas instructions Post semen check in 12 weeks  Chief Complaint: Chief Complaint  Patient presents with   VAS    HPI: Patrick Dyer is a 37 y.o. male who presents for planned vasectomy.    Portions of the above documentation were copied from a prior visit for review purposes only.  Allergies: Allergies  Allergen Reactions   Sulfa Antibiotics Swelling    Redness of the skin    PMH: Past Medical History:  Diagnosis Date   History of chicken pox     PSH: Past Surgical History:  Procedure Laterality Date   WISDOM TOOTH EXTRACTION      SH: Social History   Tobacco Use   Smoking status: Never   Smokeless tobacco: Never  Vaping Use   Vaping Use: Never used  Substance Use Topics   Alcohol use: No   Drug use: No    ROS: Constitutional:  Negative for fever, chills, weight loss CV: Negative for chest pain, previous MI, hypertension Respiratory:  Negative for shortness of breath, wheezing, sleep apnea, frequent cough GI:  Negative for nausea, vomiting, bloody stool, GERD  VASECTOMY PROCEDURE:  Patrick Dyer presents for vasectomy following previous vasectomy consultation and permit is signed.  Preprocedural time out is performed.  The patient's anterior scrotal wall is shaved and prepped with Betadine in standard sterile fashion.  1% lidocaine is used as local anesthetic in the scrotal and peri vasal tissue.  A standard median raphe punch incision is made and a no scalpel technique vasectomy is performed.  Bilateral vas are isolated from the peri vasal tissue and an approximately 1 cm segment of vas is excised.  Proximal and distal segments are internally cauterized with electric heat cautery. Interposition of perivasal tissue was performed.  Bilateral palpation confirms bilateral vasectomy defect and no significant bleeding or hematoma is identified.  Neosporin gauze dressing and a scrotal support are  applied.    Disposition: Patient is discharged home with Rx for pain medication and antibiotics.  Patient is given routine vasectomy instructions.   Most importantly, he is instructed and cautioned again regarding the need for protected intercourse until such time that a single  negative semen analysis has been obtained.  The initial semen analysis will be checked in approximately 12  weeks.  The patient reports a clear understanding.  He will call with any interval questions or concerns.

## 2023-07-28 ENCOUNTER — Telehealth: Payer: Self-pay

## 2023-07-28 DIAGNOSIS — Z302 Encounter for sterilization: Secondary | ICD-10-CM

## 2023-07-28 NOTE — Telephone Encounter (Signed)
Left msg for pt that a sample is needed after procedure. Requested pt call office to schedule a time to drop off specimen.

## 2023-07-28 NOTE — Telephone Encounter (Signed)
-----   Message from Joline Maxcy sent at 07/28/2023  9:05 AM EDT ----- Regarding: post vas semen analysis Please remind him he is due for post vas semen analysis.  Make sure he has appt and order. thanks

## 2023-08-04 ENCOUNTER — Other Ambulatory Visit: Payer: BC Managed Care – PPO

## 2023-08-04 DIAGNOSIS — Z302 Encounter for sterilization: Secondary | ICD-10-CM | POA: Diagnosis not present

## 2023-08-05 LAB — POST-VAS SPERM EVALUATION,QUAL: Volume: 1.3 mL

## 2023-10-12 IMAGING — CT CT RENAL STONE PROTOCOL
2 of 4 series · 17 of 46 positions shown, 19 images · non-contrast
Comparison: 03/14/2021

CLINICAL DATA: Right side flank pain

EXAM:
CT ABDOMEN AND PELVIS WITHOUT CONTRAST
TECHNIQUE: Multidetector CT imaging of the abdomen and pelvis was performed
following the standard protocol without IV contrast.

[Series 2: axial st · axial · 0.82mm/px · z∈[-580,-155]mm · 14 of 96 slices shown, 16 images]
[im 6/96  soft-tissue]
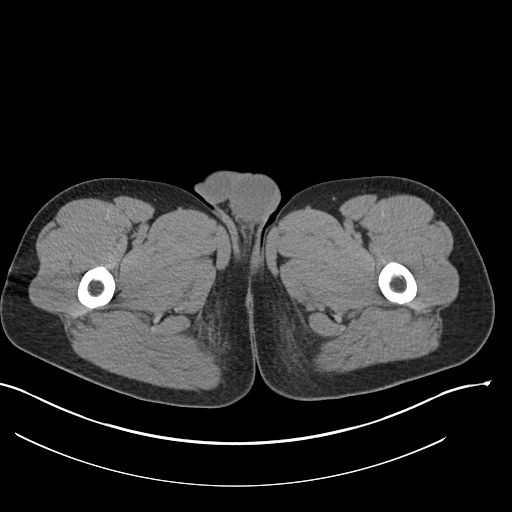
[im 6/96  bone]
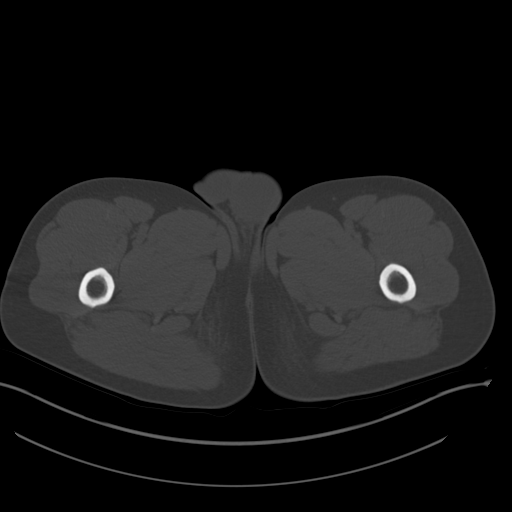
[im 11/96  soft-tissue]
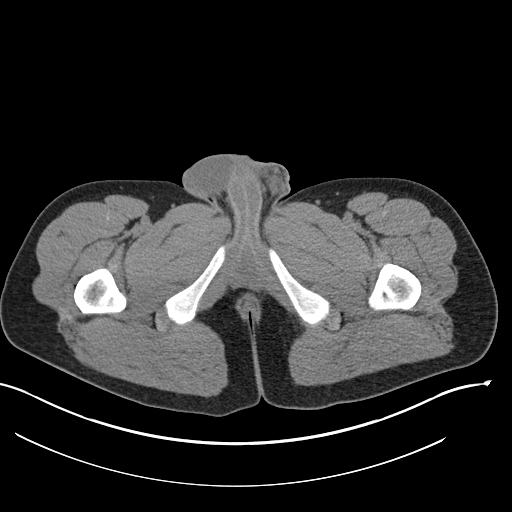
[im 21/96  soft-tissue]
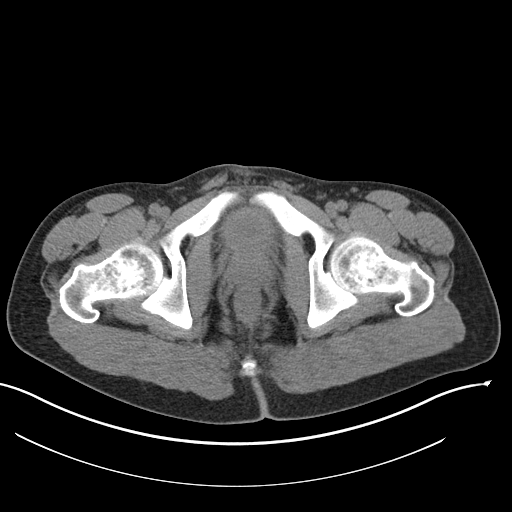
[im 26/96  soft-tissue]
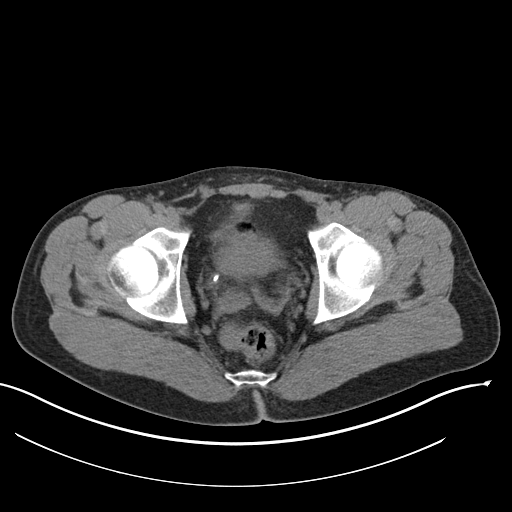
[im 31/96  soft-tissue]
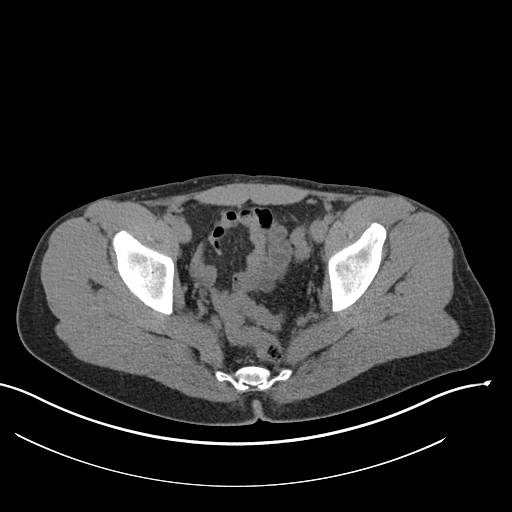
[im 41/96  soft-tissue]
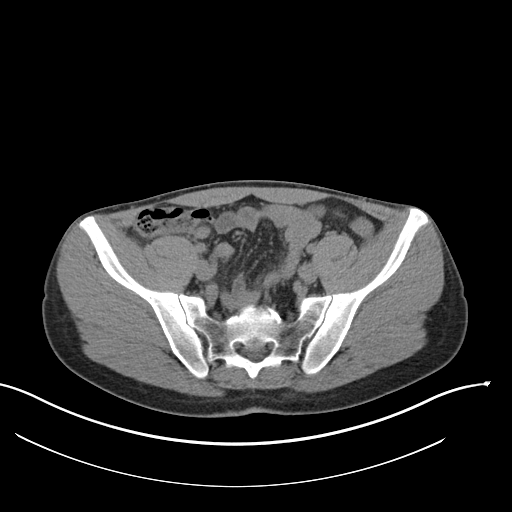
[im 46/96  soft-tissue]
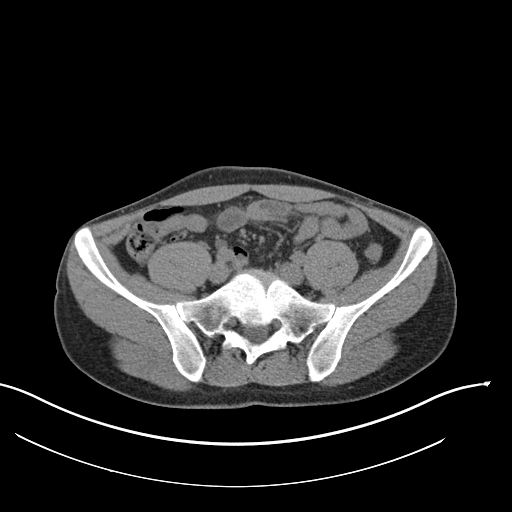
[im 51/96  soft-tissue]
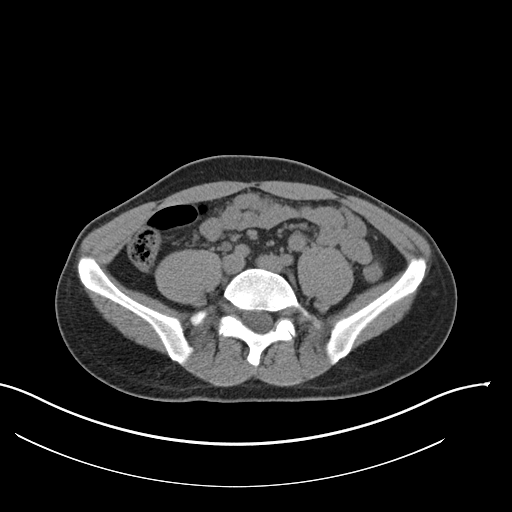
[im 56/96  soft-tissue]
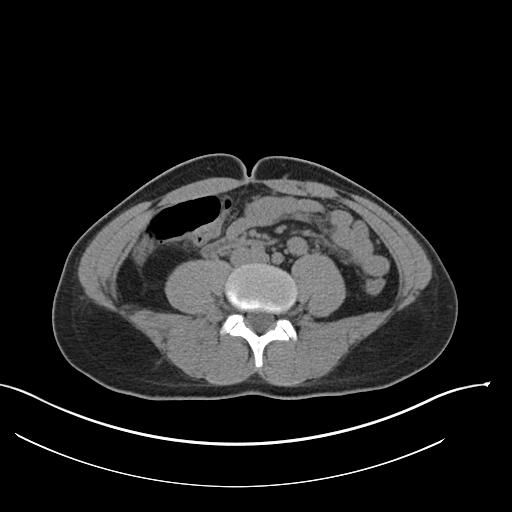
[im 56/96  bone]
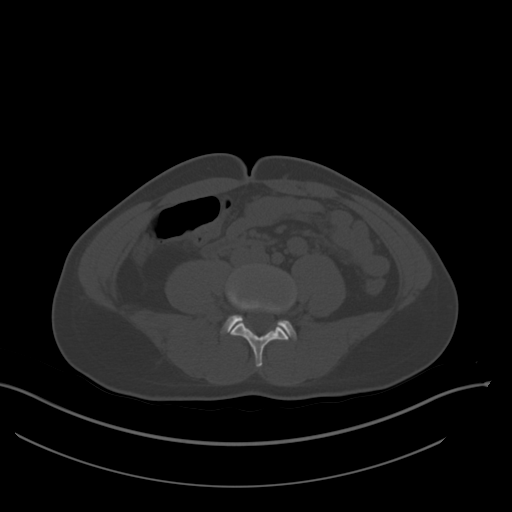
[im 66/96  soft-tissue]
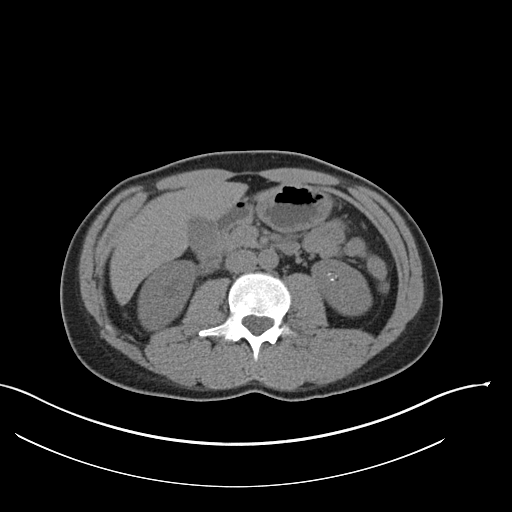
[im 71/96  soft-tissue]
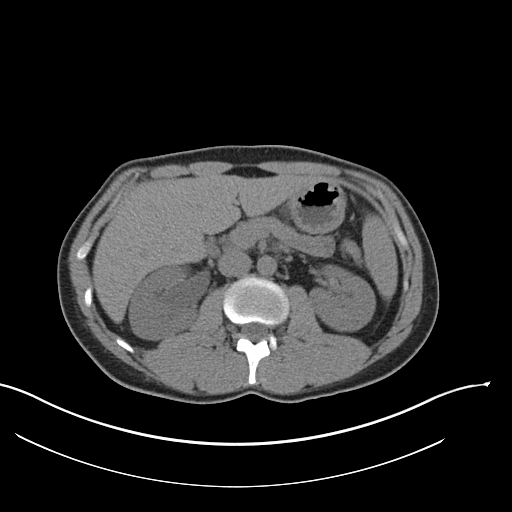
[im 76/96  soft-tissue]
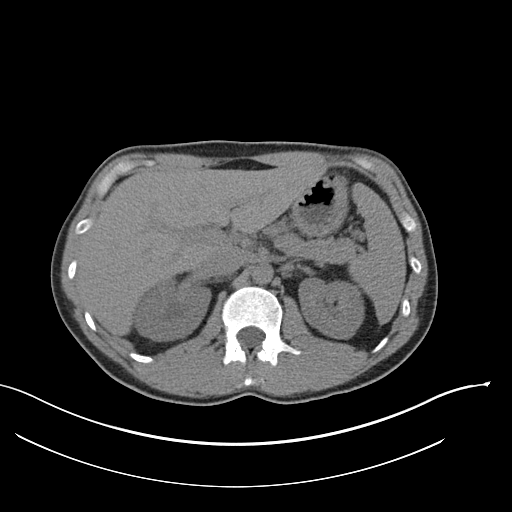
[im 86/96  soft-tissue]
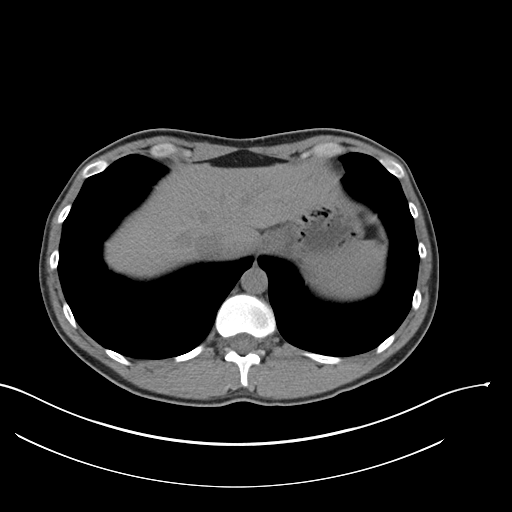
[im 91/96  soft-tissue]
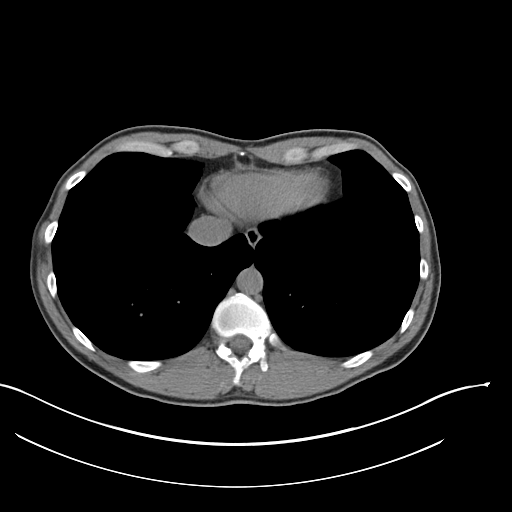

[Series 4: coronal · coronal · 0.80mm/px · 3 of 122 slices shown]
[im 41/122  soft-tissue]
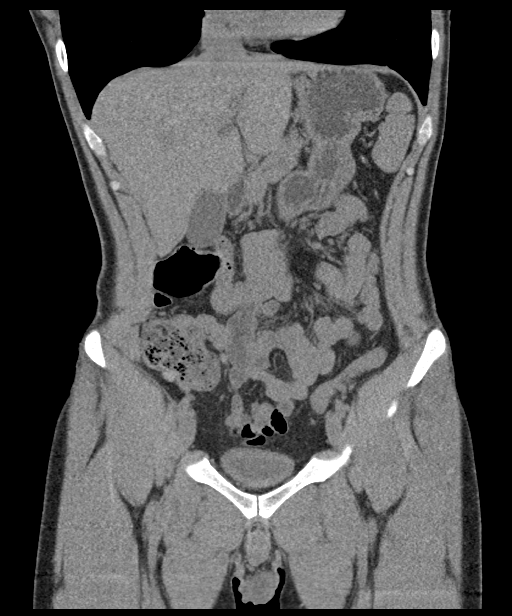
[im 54/122  soft-tissue]
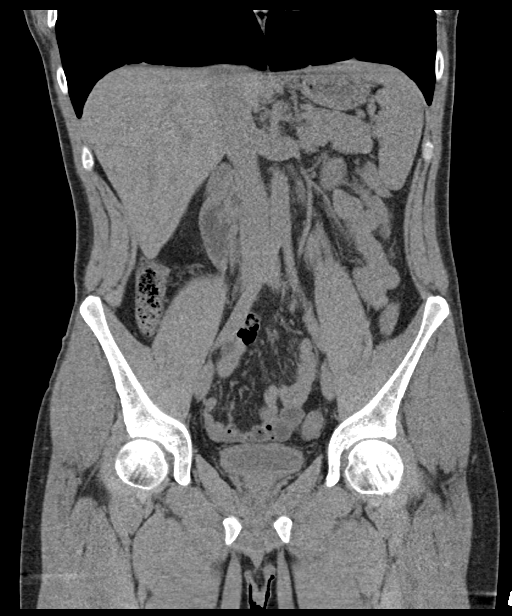
[im 68/122  soft-tissue]
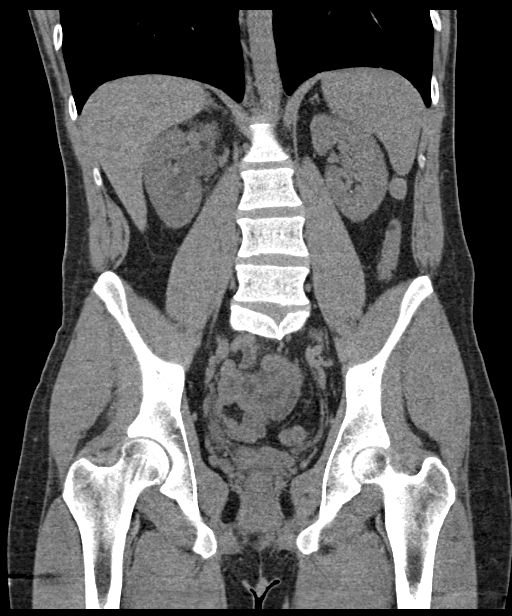

[17 of 46 positions shown; findings below may reference images not displayed]

FINDINGS: Lower chest: Lung bases are clear. No effusions. Heart is normal
size.

Hepatobiliary: No focal hepatic abnormality. Gallbladder
unremarkable.

Pancreas: No focal abnormality or ductal dilatation.

Spleen: No focal abnormality.  Normal size.

Adrenals/Urinary Tract: Punctate nonobstructing stones in the lower
pole of the left kidney. No hydronephrosis on the left. Mild
hydronephrosis on the right 2 to 5 mm distal right ureteral stone.
Adrenal glands and urinary bladder unremarkable.

Stomach/Bowel: Normal appendix. Stomach, large and small bowel
grossly unremarkable.

Vascular/Lymphatic: No evidence of aneurysm or adenopathy.

Reproductive: No visible focal abnormality.

Other: No free fluid or free air.

Musculoskeletal: No acute bony abnormality.
IMPRESSION: 5 mm right UVJ stone with mild right hydronephrosis.

Left punctate nephrolithiasis.

## 2024-04-05 ENCOUNTER — Encounter: Payer: Self-pay | Admitting: Family Medicine

## 2024-04-05 ENCOUNTER — Ambulatory Visit (INDEPENDENT_AMBULATORY_CARE_PROVIDER_SITE_OTHER): Payer: BC Managed Care – PPO | Admitting: Family Medicine

## 2024-04-05 ENCOUNTER — Ambulatory Visit: Payer: Self-pay | Admitting: Family Medicine

## 2024-04-05 VITALS — BP 110/72 | HR 86 | Temp 98.0°F | Resp 16 | Ht 73.0 in | Wt 202.0 lb

## 2024-04-05 DIAGNOSIS — Z0000 Encounter for general adult medical examination without abnormal findings: Secondary | ICD-10-CM | POA: Diagnosis not present

## 2024-04-05 LAB — COMPREHENSIVE METABOLIC PANEL WITH GFR
ALT: 41 U/L (ref 0–53)
AST: 25 U/L (ref 0–37)
Albumin: 4.7 g/dL (ref 3.5–5.2)
Alkaline Phosphatase: 52 U/L (ref 39–117)
BUN: 14 mg/dL (ref 6–23)
CO2: 30 meq/L (ref 19–32)
Calcium: 9.4 mg/dL (ref 8.4–10.5)
Chloride: 104 meq/L (ref 96–112)
Creatinine, Ser: 1.06 mg/dL (ref 0.40–1.50)
GFR: 90.3 mL/min (ref >60.00)
Glucose, Bld: 96 mg/dL (ref 70–99)
Potassium: 4 meq/L (ref 3.5–5.1)
Sodium: 140 meq/L (ref 135–145)
Total Bilirubin: 0.5 mg/dL (ref 0.2–1.2)
Total Protein: 7.1 g/dL (ref 6.0–8.3)

## 2024-04-05 LAB — CBC
HCT: 42.7 % (ref 39.0–52.0)
Hemoglobin: 14.8 g/dL (ref 13.0–17.0)
MCHC: 34.6 g/dL (ref 30.0–36.0)
MCV: 86.6 fl (ref 78.0–100.0)
Platelets: 237 10*3/uL (ref 150.0–400.0)
RBC: 4.93 Mil/uL (ref 4.22–5.81)
RDW: 12.4 % (ref 11.5–15.5)
WBC: 5.8 10*3/uL (ref 4.0–10.5)

## 2024-04-05 NOTE — Patient Instructions (Addendum)
Give [place] 2-3 business days to get the results of your labs back.  ? ?Keep the diet clean and stay active. ? ?Do monthly self testicular checks in the shower. You are feeling for lumps/bumps that don't belong. If you feel anything like this, let me know! ? ?Please get me a copy of your advanced directive form at your convenience.  ? ?Claritin (loratadine), Allegra (fexofenadine), Zyrtec (cetirizine) which is also equivalent to Xyzal (levocetirizine); these are listed in order from weakest to strongest. Generic, and therefore cheaper, options are in the parentheses.  ? ?Flonase (fluticasone); nasal spray that is over the counter. 2 sprays each nostril, once daily. Aim towards the same side eye when you spray. ? ?There are available OTC, and the generic versions, which may be cheaper, are in parentheses. Show this to a pharmacist if you have trouble finding any of these items. ? ?Let us know if you need anything. ?

## 2024-04-05 NOTE — Progress Notes (Signed)
 Chief Complaint  Patient presents with   Annual Exam    CPE    Well Male Patrick Dyer is here for a complete physical.   His last physical was >1 year ago.  Current diet: in general, a "healthy" diet.   Current exercise: cycling Weight trend: gained some at end of 2024 and is starting to learn Fatigue out of ordinary? No. Seat belt? Yes.   Advanced directive? Yes  Health maintenance Tetanus- Yes HIV- Yes Hep C- Yes  Past Medical History:  Diagnosis Date   History of chicken pox      Past Surgical History:  Procedure Laterality Date   WISDOM TOOTH EXTRACTION      Medications  Takes no meds routinely.     Allergies Allergies  Allergen Reactions   Sulfa Antibiotics Swelling    Redness of the skin    Family History Family History  Problem Relation Age of Onset   Cancer Neg Hx     Review of Systems: Constitutional: no fevers or chills Eye:  no recent significant change in vision Ear/Nose/Mouth/Throat:  Ears:  no hearing loss Nose/Mouth/Throat:  no complaints of nasal congestion, no sore throat Cardiovascular:  no chest pain Respiratory:  no shortness of breath Gastrointestinal:  no abdominal pain, no change in bowel habits GU:  Male: negative for dysuria Musculoskeletal/Extremities:  no pain of the joints Integumentary (Skin/Breast):  no abnormal skin lesions reported Neurologic:  no headaches Endocrine: No unexpected weight changes Hematologic/Lymphatic:  no night sweats  Exam BP 110/72 (BP Location: Left Arm, Patient Position: Sitting)   Pulse 86   Temp 98 F (36.7 C) (Oral)   Resp 16   Ht 6\' 1"  (1.854 m)   Wt 202 lb (91.6 kg)   SpO2 99%   BMI 26.65 kg/m  General:  well developed, well nourished, in no apparent distress Skin:  no significant moles, warts, or growths Head:  no masses, lesions, or tenderness Eyes:  pupils equal and round, sclera anicteric without injection Ears:  canals without lesions, TMs shiny without retraction, no obvious  effusion, no erythema Nose:  nares patent, mucosa normal Throat/Pharynx:  lips and gingiva without lesion; tongue and uvula midline; non-inflamed pharynx; no exudates or postnasal drainage Neck: neck supple without adenopathy, thyromegaly, or masses Lungs:  clear to auscultation, breath sounds equal bilaterally, no respiratory distress Cardio:  regular rate and rhythm, no bruits, no LE edema Abdomen:  abdomen soft, nontender; bowel sounds normal; no masses or organomegaly Genital (male): Deferred Rectal: Deferred Musculoskeletal:  symmetrical muscle groups noted without atrophy or deformity Extremities:  no clubbing, cyanosis, or edema, no deformities, no skin discoloration Neuro:  gait normal; deep tendon reflexes normal and symmetric Psych: well oriented with normal range of affect and appropriate judgment/insight  Assessment and Plan  Well adult exam - Plan: CBC, Comprehensive metabolic panel with GFR  Well 38 y.o. male. Counseled on diet and exercise. Self testicular exams recommended at least monthly.  Lipids normal thru work.  Advanced directive form requested today.  Other orders as above. Follow up in 1 year pending the above workup. The patient voiced understanding and agreement to the plan.  Shellie Dials [place], DO 04/05/24 8:57 AM

## 2025-04-06 ENCOUNTER — Encounter: Admitting: Family Medicine
